# Patient Record
Sex: Female | Born: 1955 | Race: Asian | Hispanic: No | Marital: Married | Smoking: Former smoker
Health system: Southern US, Community
[De-identification: ages and names within clinical notes are randomized; demographics above are authoritative.]

## PROBLEM LIST (undated history)

## (undated) DIAGNOSIS — M899 Disorder of bone, unspecified: Secondary | ICD-10-CM

## (undated) DIAGNOSIS — M8580 Other specified disorders of bone density and structure, unspecified site: Secondary | ICD-10-CM

## (undated) HISTORY — DX: Disorder of bone, unspecified: M89.9

## (undated) HISTORY — DX: Other specified disorders of bone density and structure, unspecified site: M85.80

## (undated) HISTORY — PX: LAPAROSCOPIC HYSTERECTOMY: SHX1926

## (undated) HISTORY — PX: COLONOSCOPY: SHX174

---

## 2016-05-17 LAB — HM COLONOSCOPY

## 2019-08-12 LAB — HM DEXA SCAN

## 2020-11-03 ENCOUNTER — Other Ambulatory Visit: Payer: Self-pay

## 2020-11-03 ENCOUNTER — Ambulatory Visit (INDEPENDENT_AMBULATORY_CARE_PROVIDER_SITE_OTHER): Payer: Medicare HMO | Admitting: Family Medicine

## 2020-11-03 ENCOUNTER — Encounter: Payer: Self-pay | Admitting: Family Medicine

## 2020-11-03 VITALS — BP 126/80 | HR 75 | Ht 63.0 in | Wt 109.0 lb

## 2020-11-03 DIAGNOSIS — G4700 Insomnia, unspecified: Secondary | ICD-10-CM

## 2020-11-03 DIAGNOSIS — R350 Frequency of micturition: Secondary | ICD-10-CM | POA: Diagnosis not present

## 2020-11-03 DIAGNOSIS — M8580 Other specified disorders of bone density and structure, unspecified site: Secondary | ICD-10-CM | POA: Diagnosis not present

## 2020-11-03 DIAGNOSIS — Z1231 Encounter for screening mammogram for malignant neoplasm of breast: Secondary | ICD-10-CM | POA: Diagnosis not present

## 2020-11-03 DIAGNOSIS — R631 Polydipsia: Secondary | ICD-10-CM

## 2020-11-03 DIAGNOSIS — R319 Hematuria, unspecified: Secondary | ICD-10-CM

## 2020-11-03 LAB — POCT URINALYSIS DIP (PROADVANTAGE DEVICE)
Bilirubin, UA: NEGATIVE
Glucose, UA: NEGATIVE mg/dL
Ketones, POC UA: NEGATIVE mg/dL
Leukocytes, UA: NEGATIVE
Nitrite, UA: NEGATIVE
Protein Ur, POC: NEGATIVE mg/dL
Specific Gravity, Urine: 1.025
Urobilinogen, Ur: NEGATIVE
pH, UA: 6 (ref 5.0–8.0)

## 2020-11-03 NOTE — Patient Instructions (Addendum)
It was a pleasure meeting you today.  You can call and schedule your mammogram at the breast center as discussed.  For osteopenia-make sure you are getting adequate calcium in your diet, 1200 mg is recommended each day.  Take a vitamin D3 supplement and get plenty of weightbearing exercises.  It is recommended that you repeat your bone density study after 2 years.  Use good sleep hygiene to help yourself get more than 4 hours of sleep per night.  Avoid TV, telephone, lights or any other distractions to keep you awake.  Avoid eating or caffeine at least 3 hours before bed . You can try melatonin if you would like.

## 2020-11-03 NOTE — Progress Notes (Signed)
   Subjective:    Patient ID: Carrie Maxwell, female    DOB: [birth year], 64 y.o.   MRN: [id number]  HPI Chief Complaint  Patient presents with  . New Patient (Initial Visit)    establish care    She is new to the practice and here to establish care. Moved here from [place] in August 2021.  Previous medical care: in [place]   Other providers: PCP- Dr. Bridgett Larsson in [place]  Osteopenia- taking vitamin D   Sleep issues - sleeping 3-4 hours. States she has always slept this much due to her jobs.  Benadryl helps.   Urinary frequency for years and states she is "always thirsty".  Denies history of diabetes.    Social history: Lives with fiance, 2 kids, retired. 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    Immunizations: Tdap 2017  Health maintenance:  Mammogram: 08/2019 and normal Colonoscopy: 06/2016 in [place]  DEXA: 07/2019 osteopenia  Hysterectomy     Reviewed allergies, medications, past medical, surgical, family, and social history.    Review of Systems Pertinent positives and negatives in the history of present illness.     Objective:   Physical Exam BP 126/80   Pulse 75   Ht 5\' 3"  (1.6 m)   Wt 109 lb (49.4 kg)   SpO2 96%   BMI 19.31 kg/m   Alert and in no distress.  Neck is supple without adenopathy or thyromegaly. Cardiac exam shows a regular sinus rhythm without murmurs or gallops. Lungs are clear to auscultation. Extremities without edema. Skin is warm and dry.        Assessment & Plan:  Insomnia, unspecified type - Plan: Basic metabolic panel, TSH, T4, free -Counseling on good sleep hygiene training herself to sleep when she goes to bed.  Avoid taking her phone to bed with her.  Urinary frequency - Plan: POCT Urinalysis DIP (Proadvantage Device), Basic metabolic panel -No sign of infection  Osteopenia, unspecified location -Recommend getting adequate calcium in her diet, continue on vitamin D supplement and get plenty of  weightbearing exercises as she does  Encounter for screening mammogram for malignant neoplasm of breast - Plan: MM DIGITAL SCREENING BILATERAL -She will call and schedule her mammogram.  Hematuria, unspecified type - Plan: Urine Microscopic -Trace of blood on urinalysis dipstick.  Follow-up pending microscopic urinalysis  Increased thirst - Plan: POCT Urinalysis DIP (Proadvantage Device), Basic metabolic panel, TSH, T4, free -Check labs and follow-up

## 2020-11-04 LAB — BASIC METABOLIC PANEL
BUN/Creatinine Ratio: 16 (ref 12–28)
BUN: 9 mg/dL (ref 8–27)
CO2: 25 mmol/L (ref 20–29)
Calcium: 9.2 mg/dL (ref 8.7–10.3)
Chloride: 102 mmol/L (ref 96–106)
Creatinine, Ser: 0.55 mg/dL — ABNORMAL LOW (ref 0.57–1.00)
GFR calc Af Amer: 113 mL/min/{1.73_m2} (ref >59)
GFR calc non Af Amer: 98 mL/min/{1.73_m2} (ref >59)
Glucose: 91 mg/dL (ref 65–99)
Potassium: 4.3 mmol/L (ref 3.5–5.2)
Sodium: 140 mmol/L (ref 134–144)

## 2020-11-04 LAB — T4, FREE: Free T4: 1.22 ng/dL (ref 0.82–1.77)

## 2020-11-04 LAB — URINALYSIS, MICROSCOPIC ONLY
Bacteria, UA: NONE SEEN
Casts: NONE SEEN /lpf
Epithelial Cells (non renal): NONE SEEN /hpf (ref 0–10)
RBC, Urine: NONE SEEN /hpf (ref 0–2)

## 2020-11-04 LAB — TSH: TSH: 1.99 u[IU]/mL (ref 0.450–4.500)

## 2020-11-04 NOTE — Progress Notes (Signed)
No blood in her urine under the microscope. Her electrolytes and kidney function is fine. Blood sugar normal so no sign of diabetes. Thyroid function also normal.

## 2021-02-28 ENCOUNTER — Ambulatory Visit: Payer: Medicare HMO | Admitting: Family Medicine

## 2021-04-21 ENCOUNTER — Other Ambulatory Visit: Payer: Self-pay

## 2021-04-21 ENCOUNTER — Encounter: Payer: Self-pay | Admitting: Family Medicine

## 2021-04-21 ENCOUNTER — Ambulatory Visit (INDEPENDENT_AMBULATORY_CARE_PROVIDER_SITE_OTHER): Payer: Managed Care, Other (non HMO) | Admitting: Family Medicine

## 2021-04-21 VITALS — BP 110/70 | HR 96 | Ht 64.0 in | Wt 112.2 lb

## 2021-04-21 DIAGNOSIS — Z7189 Other specified counseling: Secondary | ICD-10-CM | POA: Diagnosis not present

## 2021-04-21 DIAGNOSIS — Z1231 Encounter for screening mammogram for malignant neoplasm of breast: Secondary | ICD-10-CM | POA: Diagnosis not present

## 2021-04-21 DIAGNOSIS — Z1211 Encounter for screening for malignant neoplasm of colon: Secondary | ICD-10-CM | POA: Diagnosis not present

## 2021-04-21 DIAGNOSIS — Z7185 Encounter for immunization safety counseling: Secondary | ICD-10-CM | POA: Diagnosis not present

## 2021-04-21 DIAGNOSIS — Z0000 Encounter for general adult medical examination without abnormal findings: Secondary | ICD-10-CM

## 2021-04-21 NOTE — Patient Instructions (Signed)
  Ms. Kreh , Thank you for taking time to come for your Medicare Wellness Visit. I appreciate your ongoing commitment to your health goals. Please review the following plan we discussed and let me know if I can assist you in the future.   These are the goals we discussed:  I will request your medical records from your previous PCP and then get you back for any overdue vaccinations.   Call and schedule your mammogram at [facility].   You will hear from [facility] to discuss colonoscopy     This is a list of the screening recommended for you and due dates:  Health Maintenance  Topic Date Due  . COVID-19 Vaccine (1) Never done  . Hepatitis C Screening: USPSTF Recommendation to screen - Ages 44-79 yo.  Never done  . Tetanus Vaccine  Never done  . Colon Cancer Screening  Never done  . Mammogram  Never done  . DEXA scan (bone density measurement)  Never done  . Pneumonia vaccines (1 of 2 - PCV13) Never done  . Flu Shot  07/17/2021  . HPV Vaccine  Aged Out

## 2021-04-21 NOTE — Progress Notes (Signed)
Carrie Maxwell is a 65 y.o. female who presents for annual wellness visit and follow-up on chronic medical conditions.  She has the following concerns:  She does not think she has had an AWV in the past.   Chief Complaint  Patient presents with  . cpe/awv    Cpe awv, due for pneumonia shot but not sure if she wants it     States she recently had her Covid Booster  She does not have any immunization records with her and her previous medical records will need to be requested today. She denies any concerns.   There is no immunization history on file for this patient. Last Pap smear: had hysterecomy- [place] many years ago Last mammogram: couple years ago in [place] Last colonoscopy: many years ago in [place] Last DEXA: normal in past and does not want another one  Dentist: [facility]: not sure who she saw last year but in [place] Exercise: daily  Other doctors caring for patient include: None   Depression screen:  See questionnaire below.  Depression screen [facility] 2/9 04/21/2021 11/03/2020  Decreased Interest 0 0  Down, Depressed, Hopeless 0 0  PHQ - 2 Score 0 0    Fall Risk Screen: see questionnaire below. Fall Risk  04/21/2021  Falls in the past year? 0  Number falls in past yr: 0  Injury with Fall? 0  Risk for fall due to : No Fall Risks  Follow up Falls evaluation completed    ADL screen:  See questionnaire below Functional Status Survey: Is the patient deaf or have difficulty hearing?: No Does the patient have difficulty seeing, even when wearing glasses/contacts?: No Does the patient have difficulty concentrating, remembering, or making decisions?: No Does the patient have difficulty walking or climbing stairs?: No Does the patient have difficulty dressing or bathing?: No Does the patient have difficulty doing errands alone such as visiting a doctor's office or shopping?: No   End of Life Discussion:  Patient does not have a living will and medical power of  attorney.  Advanced directives discussed and paperwork provided.  Review of Systems Constitutional: -fever, -chills, -sweats, -unexpected weight change, -anorexia, -fatigue Allergy: -sneezing, -itching, -congestion Dermatology: denies changing moles, rash, lumps, new worrisome lesions ENT: -runny nose, -ear pain, -sore throat, -hoarseness, -sinus pain, -teeth pain, -tinnitus, -hearing loss, -epistaxis Cardiology:  -chest pain, -palpitations, -edema, -orthopnea, -paroxysmal nocturnal dyspnea Respiratory: -cough, -shortness of breath, -dyspnea on exertion, -wheezing, -hemoptysis Gastroenterology: -abdominal pain, -nausea, -vomiting, -diarrhea, -constipation, -blood in stool, -changes in bowel movement, -dysphagia Hematology: -bleeding or bruising problems Musculoskeletal: -arthralgias, -myalgias, -joint swelling, -back pain, -neck pain, -cramping, -gait changes Ophthalmology: -vision changes, -eye redness, -itching, -discharge Urology: -dysuria, -difficulty urinating, -hematuria, -urinary frequency, -urgency, -incontinence Neurology: -headache, -weakness, -tingling, -numbness, -speech abnormality, -memory loss, -falls, -dizziness Psychology:  -depressed mood, -agitation, -sleep problems    PHYSICAL EXAM:  BP 110/70   Pulse 96   Ht 5\' 4"  (1.626 m)   Wt 112 lb 3.2 oz (50.9 kg)   BMI 19.26 kg/m   General Appearance: Alert, cooperative, no distress, appears stated age Head: Normocephalic, without obvious abnormality, atraumatic Eyes: PERRL, conjunctiva/corneas clear, EOM's intact Ears: Normal TM's and external ear canals Nose: Mask on Throat: Mask on Neck: Supple, no lymphadenopathy; thyroid: no enlargement/tenderness/nodules; no JVD Back: ROM normal, no CVA tenderness Lungs: Clear to auscultation bilaterally without wheezes, rales or ronchi; respirations unlabored Chest Wall: No tenderness or deformity Heart: Regular rate and rhythm Breast Exam: Declines Abdomen: Soft,  non-tender,  nondistended, normoactive bowel sounds, no masses Genitalia: Declines Extremities: No clubbing, cyanosis or edema Pulses: 2+ and symmetric all extremities Skin: Skin color, texture, turgor normal, no rashes or lesions Lymph nodes: Cervical, supraclavicular, and axillary nodes normal Neurologic: CNII-XII intact, normal strength, sensation and gait Psych: Normal mood, affect, hygiene and grooming.  ASSESSMENT/PLAN: Medicare annual wellness visit, initial -Denies any issues with ADLs, memory, mood and no falls.  Advanced directive counseling done.  Routine general medical examination at a health care facility -Preventive health care reviewed.  She will call and schedule her mammogram.  Referral to GI.  Hysterectomy and declines pelvic exam.  Counseling on healthy lifestyle including diet and exercise.  Recommend regular dental and eye exams.  Immunizations reviewed and I do not have her immunization records.  I will request these today.  Discussed safety and health promotion.  Screen for colon cancer - Plan: Ambulatory referral to Gastroenterology  Encounter for screening mammogram for malignant neoplasm of breast  Advance directive discussed with patient  Immunization counseling     Discussed monthly self breast exams and yearly mammograms; at least 30 minutes of aerobic activity at least 5 days/week and weight-bearing exercise 2x/week; proper sunscreen use reviewed; healthy diet, including goals of calcium and vitamin D intake and alcohol recommendations (less than or equal to 1 drink/day) reviewed; regular seatbelt use; changing batteries in smoke detectors.  Immunization recommendations discussed.  Colonoscopy recommendations reviewed   Medicare Attestation I have personally reviewed: The patient's medical and social history Their use of alcohol, tobacco or illicit drugs Their current medications and supplements The patient's functional ability including ADLs,fall risks, home  safety risks, cognitive, and hearing and visual impairment Diet and physical activities Evidence for depression or mood disorders  The patient's weight, height, and BMI have been recorded in the chart.  I have made referrals, counseling, and provided education to the patient based on review of the above and I have provided the patient with a written personalized care plan for preventive services.     Harland Dingwall, NP-C   04/21/2021

## 2021-04-24 ENCOUNTER — Other Ambulatory Visit: Payer: Self-pay | Admitting: Family Medicine

## 2021-04-24 DIAGNOSIS — Z1231 Encounter for screening mammogram for malignant neoplasm of breast: Secondary | ICD-10-CM

## 2021-04-25 ENCOUNTER — Encounter: Payer: Self-pay | Admitting: Internal Medicine

## 2021-05-25 ENCOUNTER — Telehealth: Payer: Self-pay

## 2021-05-25 NOTE — Telephone Encounter (Signed)
Tried to call pt on both numbers listed but can not get through  Per GI- she needs an elective colonoscopy, we will call her in about 6 weeks or so. We are now working on our emergent and urgent referrals. she is more than happy to call the office at [phone] option 1 if she would like to make an appointment before that.

## 2021-05-25 NOTE — Telephone Encounter (Signed)
Pt states she hasn't heard anything about her colonoscopy.  Please let her know something

## 2021-05-26 NOTE — Telephone Encounter (Signed)
Tried to call pt multiple times but can not get through

## 2021-05-26 NOTE — Telephone Encounter (Signed)
[place] handling

## 2021-05-26 NOTE — Telephone Encounter (Signed)
Tried to call pt but pt is unreachable again today. Call will not go through

## 2021-06-13 DIAGNOSIS — Z0279 Encounter for issue of other medical certificate: Secondary | ICD-10-CM

## 2021-06-20 ENCOUNTER — Other Ambulatory Visit: Payer: Self-pay

## 2021-06-20 ENCOUNTER — Ambulatory Visit
Admission: RE | Admit: 2021-06-20 | Discharge: 2021-06-20 | Disposition: A | Discharge status: Home / Self Care | Payer: Medicare HMO | Source: Ambulatory Visit | Attending: Family Medicine | Admitting: Family Medicine

## 2021-06-20 DIAGNOSIS — Z1231 Encounter for screening mammogram for malignant neoplasm of breast: Secondary | ICD-10-CM

## 2021-08-02 ENCOUNTER — Telehealth: Payer: Self-pay | Admitting: Gastroenterology

## 2021-08-02 NOTE — Telephone Encounter (Signed)
Hey Dr. Candis Maxwell,   We received a transfer of care in our office for colonoscopy. We have the records. Could you please review and advise on scheduling?  Thank you

## 2021-08-04 NOTE — Telephone Encounter (Deleted)
Dr. Candis Schatz,   I believe the path results are within the big packet towards one of the bottom of the pages. Sorry I can't exactly remember where. I thought I saw it before I forwarded them to you. I could ask the patient again to be sure once I call to get her scheduled.

## 2021-08-04 NOTE — Telephone Encounter (Signed)
Hey Dr. Candis Schatz,   I have noted the pathology results within the records. If that is not what you are looking for please let me know.  Thank you

## 2021-08-08 ENCOUNTER — Encounter: Payer: Self-pay | Admitting: Gastroenterology

## 2021-08-08 NOTE — Telephone Encounter (Signed)
Left vm to return call to schedule appt 

## 2021-08-15 ENCOUNTER — Other Ambulatory Visit: Payer: Self-pay

## 2021-08-15 ENCOUNTER — Ambulatory Visit (AMBULATORY_SURGERY_CENTER): Payer: Self-pay

## 2021-08-15 VITALS — Ht 64.0 in | Wt 113.0 lb

## 2021-08-15 DIAGNOSIS — Z86010 Personal history of colonic polyps: Secondary | ICD-10-CM

## 2021-08-15 MED ORDER — PEG 3350-KCL-NA BICARB-NACL 420 G PO SOLR: 4000.0000 mL | Freq: Once | ORAL | 0 refills | Status: AC

## 2021-08-15 NOTE — Progress Notes (Signed)
Patient is here in-person for PV. Patient denies any allergies to eggs or soy. Patient denies any problems with anesthesia/sedation. Patient denies any oxygen use at home. Patient denies taking any diet/weight loss medications or blood thinners. Patient is aware of our care-partner policy and Covid-19 safety protocol.   EMMI education assigned to the patient for the procedure, sent to MyChart.   Patient is COVID-19 vaccinated. 

## 2021-08-23 ENCOUNTER — Encounter: Payer: Self-pay | Admitting: Gastroenterology

## 2021-08-27 IMAGING — MG MM DIGITAL SCREENING BILAT W/ TOMO AND CAD
8 series · 9 of 24 positions shown · non-contrast
Comparison: Previous exam(s).

CLINICAL DATA: Screening.

EXAM:
DIGITAL SCREENING BILATERAL MAMMOGRAM WITH TOMOSYNTHESIS AND CAD
TECHNIQUE: Bilateral screening digital craniocaudal and mediolateral oblique
mammograms were obtained. Bilateral screening digital breast
tomosynthesis was performed. The images were evaluated with
computer-aided detection.

[R MLO synth-2D]
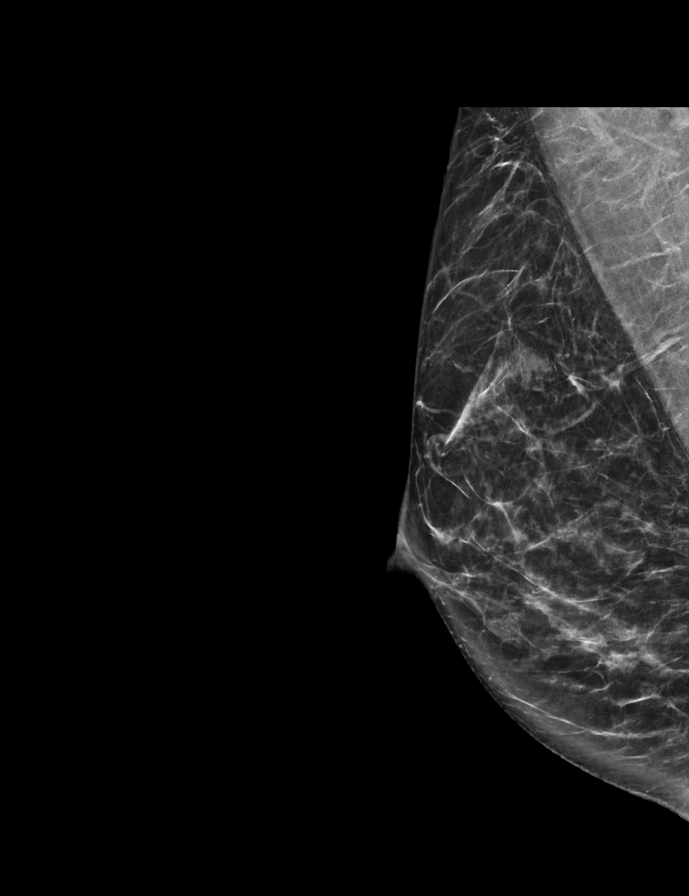

[L MLO synth-2D]
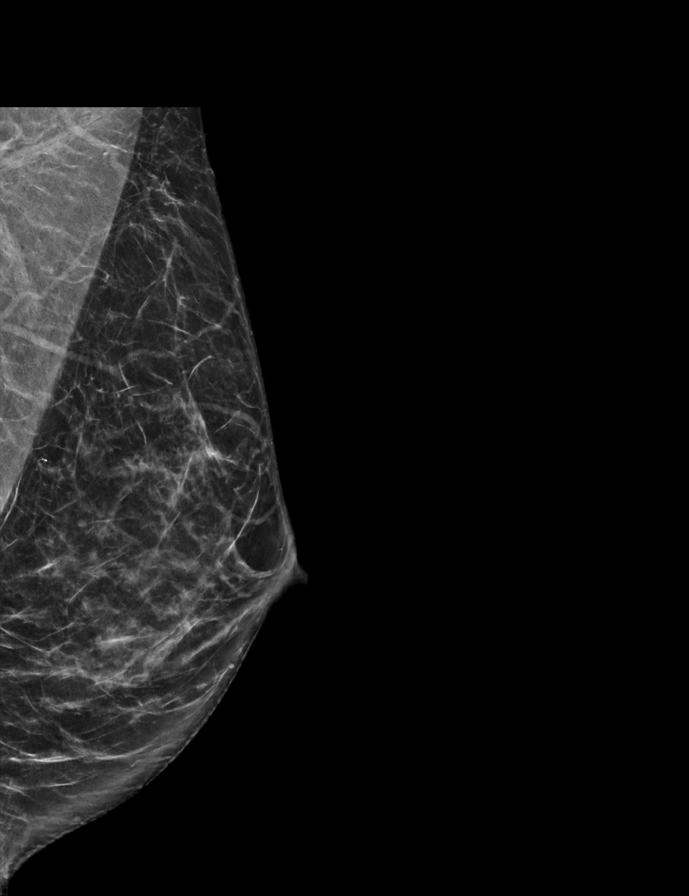

[L CC synth-2D]
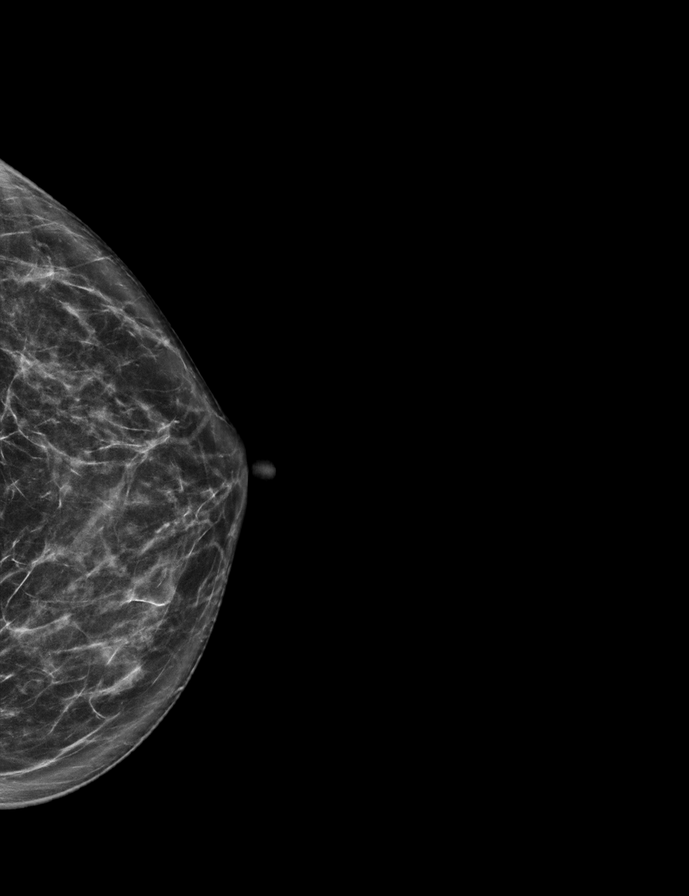

[R CC synth-2D]
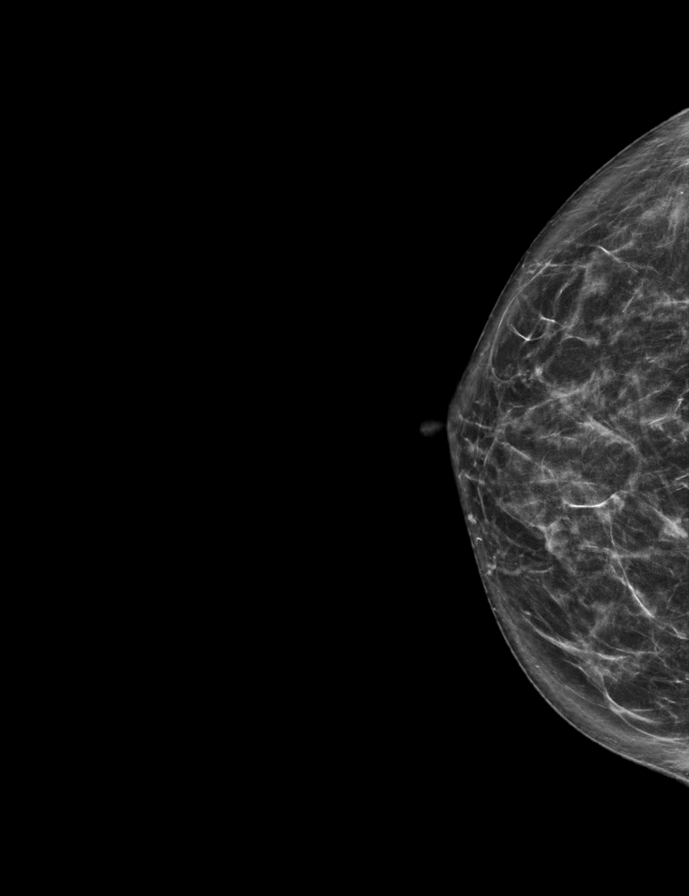

[R CC tomo · 2 of 66 frames shown]
[frame 22/66]
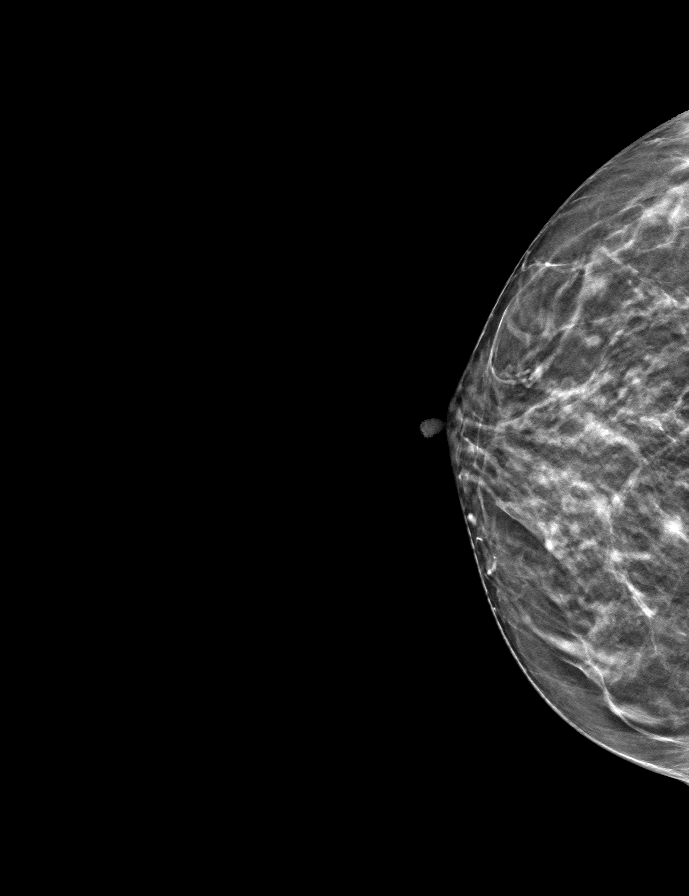
[frame 33/66]
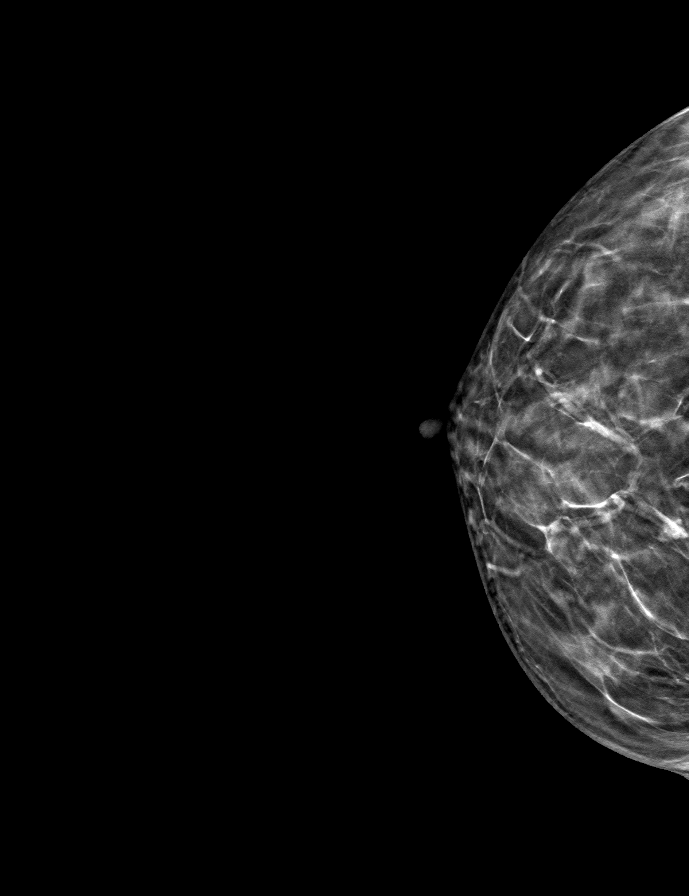

[R MLO tomo · tomo slice 34/67.0]
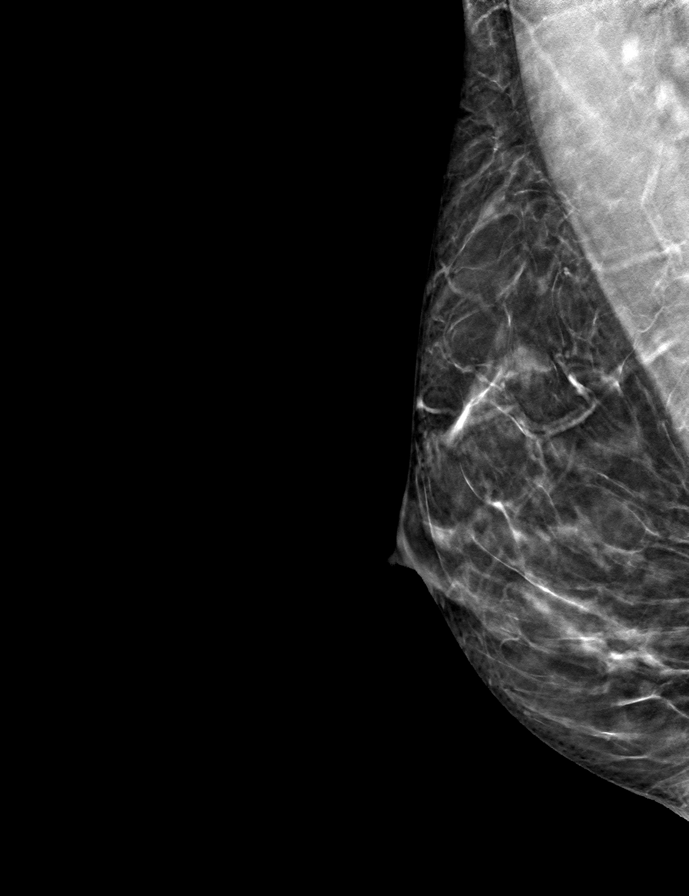

[L MLO tomo · tomo slice 34/67.0]
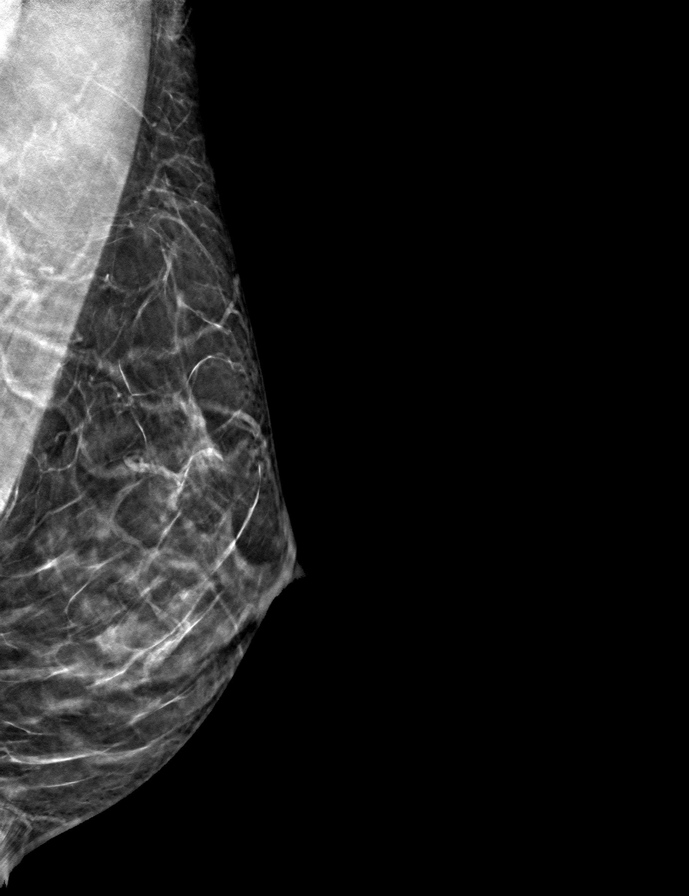

[L CC tomo · tomo slice 33/66.0]
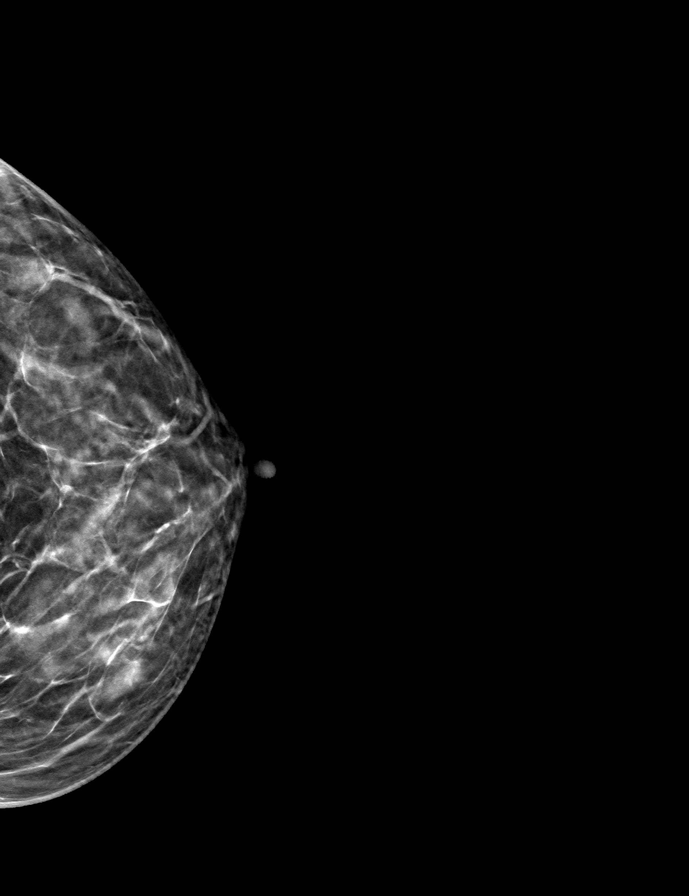

[9 of 24 positions shown; findings below may reference images not displayed]

ACR Breast Density Category c: The breast tissue is heterogeneously
dense, which may obscure small masses.
FINDINGS: There are no findings suspicious for malignancy.
IMPRESSION: No mammographic evidence of malignancy. A result letter of this
screening mammogram will be mailed directly to the patient.

RECOMMENDATION:
Screening mammogram in one year. (Code:[id number]-BC3)

BI-RADS CATEGORY  1: Negative.

## 2021-08-29 ENCOUNTER — Encounter: Payer: Self-pay | Admitting: Gastroenterology

## 2021-08-29 ENCOUNTER — Ambulatory Visit (AMBULATORY_SURGERY_CENTER): Payer: Managed Care, Other (non HMO) | Admitting: Gastroenterology

## 2021-08-29 ENCOUNTER — Other Ambulatory Visit: Payer: Self-pay

## 2021-08-29 VITALS — BP 112/73 | HR 60 | Temp 97.1°F | Resp 11 | Ht 64.0 in | Wt 113.0 lb

## 2021-08-29 DIAGNOSIS — D123 Benign neoplasm of transverse colon: Secondary | ICD-10-CM | POA: Diagnosis not present

## 2021-08-29 DIAGNOSIS — Z86010 Personal history of colonic polyps: Secondary | ICD-10-CM | POA: Diagnosis not present

## 2021-08-29 DIAGNOSIS — K5730 Diverticulosis of large intestine without perforation or abscess without bleeding: Secondary | ICD-10-CM

## 2021-08-29 DIAGNOSIS — D127 Benign neoplasm of rectosigmoid junction: Secondary | ICD-10-CM

## 2021-08-29 DIAGNOSIS — K640 First degree hemorrhoids: Secondary | ICD-10-CM

## 2021-08-29 MED ORDER — SODIUM CHLORIDE 0.9 % IV SOLN: 500.0000 mL | Freq: Once | INTRAVENOUS | Status: DC

## 2021-08-29 NOTE — Progress Notes (Signed)
Called to room to assist during endoscopic procedure.  Patient ID and intended procedure confirmed with present staff. Received instructions for my participation in the procedure from the performing physician.  

## 2021-08-29 NOTE — Patient Instructions (Signed)
Resume previous diet and continue present medications. Awaiting pathology results. Repeat Colonoscopy date to be determined based on pathology results.  YOU HAD AN ENDOSCOPIC PROCEDURE TODAY AT [facility]:   Refer to the procedure report that was given to you for any specific questions about what was found during the examination.  If the procedure report does not answer your questions, please call your gastroenterologist to clarify.  If you requested that your care partner not be given the details of your procedure findings, then the procedure report has been included in a sealed envelope for you to review at your convenience later.  YOU SHOULD EXPECT: Some feelings of bloating in the abdomen. Passage of more gas than usual.  Walking can help get rid of the air that was put into your GI tract during the procedure and reduce the bloating. If you had a lower endoscopy (such as a colonoscopy or flexible sigmoidoscopy) you may notice spotting of blood in your stool or on the toilet paper. If you underwent a bowel prep for your procedure, you may not have a normal bowel movement for a few days.  Please Note:  You might notice some irritation and congestion in your nose or some drainage.  This is from the oxygen used during your procedure.  There is no need for concern and it should clear up in a day or so.  SYMPTOMS TO REPORT IMMEDIATELY:  Following lower endoscopy (colonoscopy or flexible sigmoidoscopy):  Excessive amounts of blood in the stool  Significant tenderness or worsening of abdominal pains  Swelling of the abdomen that is new, acute  Fever of 100F or higher  For urgent or emergent issues, a gastroenterologist can be reached at any hour by calling [phone]. Do not use MyChart messaging for urgent concerns.    DIET:  We do recommend a small meal at first, but then you may proceed to your regular diet.  Drink plenty of fluids but you should avoid alcoholic beverages  for 24 hours.  ACTIVITY:  You should plan to take it easy for the rest of today and you should NOT DRIVE or use heavy machinery until tomorrow (because of the sedation medicines used during the test).    FOLLOW UP: Our staff will call the number listed on your records 48-72 hours following your procedure to check on you and address any questions or concerns that you may have regarding the information given to you following your procedure. If we do not reach you, we will leave a message.  We will attempt to reach you two times.  During this call, we will ask if you have developed any symptoms of COVID 19. If you develop any symptoms (ie: fever, flu-like symptoms, shortness of breath, cough etc.) before then, please call [phone].  If you test positive for Covid 19 in the 2 weeks post procedure, please call and report this information to [place].    If any biopsies were taken you will be contacted by phone or by letter within the next 1-3 weeks.  Please call us at [phone] if you have not heard about the biopsies in 3 weeks.    SIGNATURES/CONFIDENTIALITY: You and/or your care partner have signed paperwork which will be entered into your electronic medical record.  These signatures attest to the fact that that the information above on your After Visit Summary has been reviewed and is understood.  Full responsibility of the confidentiality of this discharge information lies with you and/or your  care-partner.  

## 2021-08-29 NOTE — Progress Notes (Signed)
C.W. vital signs. 

## 2021-08-29 NOTE — Op Note (Signed)
[facility] Patient Name: Carrie Maxwell Procedure Date: 08/29/2021 7:37 AM MRN: [id number] Endoscopist: Nicki Reaper E. Candis Schatz , MD Age: 65 Referring MD:  Date of Birth: 25-Jul-1956 Gender: Female Account #: [id number] Procedure:                Colonoscopy Indications:              High risk colon cancer surveillance: Personal                            history of colonic polyps Medicines:                Monitored Anesthesia Care Procedure:                Pre-Anesthesia Assessment:                           - Prior to the procedure, a History and Physical                            was performed, and patient medications and                            allergies were reviewed. The patient's tolerance of                            previous anesthesia was also reviewed. The risks                            and benefits of the procedure and the sedation                            options and risks were discussed with the patient.                            All questions were answered, and informed consent                            was obtained. Prior Anticoagulants: The patient has                            taken no previous anticoagulant or antiplatelet                            agents. ASA Grade Assessment: II - A patient with                            mild systemic disease. After reviewing the risks                            and benefits, the patient was deemed in                            satisfactory condition to undergo the procedure.  After obtaining informed consent, the colonoscope                            was passed under direct vision. Throughout the                            procedure, the patient's blood pressure, pulse, and                            oxygen saturations were monitored continuously. The                            Olympus [id number]-[id number]) Colonoscope was                            introduced through the anus and  advanced to the the                            terminal ileum, with identification of the                            appendiceal orifice and IC valve. The colonoscopy                            was performed without difficulty. The patient                            tolerated the procedure well. The quality of the                            bowel preparation was adequate. Scope In: 8:06:35 AM Scope Out: 8:31:02 AM Scope Withdrawal Time: 0 hours 21 minutes 23 seconds  Total Procedure Duration: 0 hours 24 minutes 27 seconds  Findings:                 The perianal and digital rectal examinations were                            normal. Pertinent negatives include normal                            sphincter tone and no palpable rectal lesions.                           A 3 mm polyp was found in the transverse colon. The                            polyp was sessile. The polyp was removed with a                            cold snare. Resection and retrieval were complete.                            Estimated blood loss was minimal.  A 3 mm polyp was found in the recto-sigmoid colon.                            The polyp was sessile. The polyp was removed with a                            cold snare. Resection and retrieval were complete.                            Estimated blood loss was minimal.                           Multiple small and large-mouthed diverticula were                            found in the transverse colon, ascending colon and                            cecum.                           The exam was otherwise normal throughout the                            examined colon.                           The terminal ileum appeared normal.                           Non-bleeding internal hemorrhoids were found during                            retroflexion.                           No additional abnormalities were found on                             retroflexion. Complications:            No immediate complications. Estimated Blood Loss:     Estimated blood loss was minimal. Impression:               - One 3 mm polyp in the transverse colon, removed                            with a cold snare. Resected and retrieved.                           - One 3 mm polyp at the recto-sigmoid colon,                            removed with a cold snare. Resected and retrieved.                           -  Diverticulosis in the transverse colon, in the                            ascending colon and in the cecum.                           - The examined portion of the ileum was normal.                           - Non-bleeding internal hemorrhoids. Recommendation:           - Patient has a contact number available for                            emergencies. The signs and symptoms of potential                            delayed complications were discussed with the                            patient. Return to normal activities tomorrow.                            Written discharge instructions were provided to the                            patient.                           - Resume previous diet.                           - Continue present medications.                           - Await pathology results.                           - Repeat colonoscopy (date not yet determined) for                            surveillance based on pathology results. Nadene Witherspoon E. Candis Schatz, MD 08/29/2021 8:37:55 AM This report has been signed electronically.

## 2021-08-29 NOTE — Progress Notes (Signed)
To pacu, VSS. Report to [email or web address] 

## 2021-08-29 NOTE — Progress Notes (Signed)
[facility] History and Physical   Primary Care Physician:  Girtha Rm, NP-C   Reason for Procedure:   Surveillance colonoscopy (2 polyps in 2017, fair prep quality)  Plan:    Surveillance colonoscopy     HPI: Carrie Maxwell is a 65 y.o. female undergoing surveillance colonoscopy.  She had a colonoscopy in 2017 in which two small polyps were removed but the bowel prep was fair quality.  She has no chronic GI symptoms and no family history of colon cancer.   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 Authorizing Provider  Multiple Vitamin (MULTIVITAMIN) tablet Take 1 tablet by mouth daily.   Yes [provider]    Current Outpatient Medications  Medication Sig Dispense Refill   Multiple Vitamin (MULTIVITAMIN) tablet Take 1 tablet by mouth daily.     Current Facility-Administered Medications  Medication Dose Route Frequency Provider Last Rate Last Admin   0.9 %  sodium chloride infusion  500 mL Intravenous Once Daryel November, MD        Allergies as of 08/29/2021   (No Known Allergies)    Family History  Problem Relation Age of Onset   Colon cancer Neg Hx    Esophageal cancer Neg Hx    Stomach cancer Neg Hx    Rectal cancer Neg Hx     Social History   Socioeconomic History   Marital status: Significant Other    Spouse name: Not on file   Number of children: Not on file   Years of education: Not on file   Highest education level: Not on file  Occupational History   Not on file  Tobacco Use   Smoking status: Former    Types: Cigarettes   Smokeless tobacco: Never  Vaping Use   Vaping Use: Never used  Substance and Sexual Activity   Alcohol use: Yes    Comment: occasionally   Drug use:  Not Currently   Sexual activity: Not on file  Other Topics Concern   Not on file  Social History Narrative   Not on file   Social Determinants of Health   Financial Resource Strain: Not on file  Food Insecurity: Not on file  Transportation Needs: Not on file  Physical Activity: Not on file  Stress: Not on file  Social Connections: Not on file  Intimate Partner Violence: Not on file    Review of Systems:  All other review of systems negative except as mentioned in the HPI.  Physical Exam: Vital signs BP (!) 134/92   Pulse 74   Temp (!) 97.1 F (36.2 C)   Ht '5\' 4"'$  (1.626 m)   Wt 113 lb (51.3 kg)   SpO2 98%   BMI 19.40 kg/m   General:   Alert,  Well-developed, well-nourished, pleasant and cooperative in NAD Lungs:  Clear throughout to auscultation.   Heart:  Regular rate and rhythm; Systolic murmur, 3/6 RUSB, no clicks, rubs,  or gallops. Abdomen:  Soft, nontender and nondistended. Normal bowel sounds.   Neuro/Psych:  Normal mood and affect. A and O x 3   Dellanira Dillow E. Candis Schatz, MD [facility]

## 2021-08-29 NOTE — Progress Notes (Signed)
Pt's states no medical or surgical changes since previsit or office visit. 

## 2021-08-31 ENCOUNTER — Telehealth: Payer: Self-pay | Admitting: *Deleted

## 2021-08-31 NOTE — Telephone Encounter (Signed)
  Follow up Call-  Call back number 08/29/2021  Post procedure Call Back phone  # [phone]  Permission to leave phone message Yes     Patient questions:  Do you have a fever, pain , or abdominal swelling? No. Pain Score  0 *  Have you tolerated food without any problems? Yes.    Have you been able to return to your normal activities? Yes.    Do you have any questions about your discharge instructions: Diet   No. Medications  No. Follow up visit  No.  Do you have questions or concerns about your Care? No.  Actions: * If pain score is 4 or above: No action needed, pain <4.  Have you developed a fever since your procedure? no  2.   Have you had an respiratory symptoms (SOB or cough) since your procedure? no  3.   Have you tested positive for COVID 19 since your procedure no  4.   Have you had any family members/close contacts diagnosed with the COVID 19 since your procedure?  no   If yes to any of these questions please route to Joylene John, RN and Joella Prince, RN

## 2021-08-31 NOTE — Telephone Encounter (Signed)
Attempted to call patient for their post-procedure follow-up call. No answer. Left voicemail.   

## 2021-09-01 ENCOUNTER — Encounter: Payer: Self-pay | Admitting: Gastroenterology

## 2022-04-24 ENCOUNTER — Ambulatory Visit: Payer: Medicare HMO | Admitting: Family Medicine

## 2022-06-15 ENCOUNTER — Encounter: Payer: Self-pay | Admitting: Internal Medicine

## 2022-08-22 ENCOUNTER — Encounter: Payer: Self-pay | Admitting: Internal Medicine

## 2022-09-25 ENCOUNTER — Encounter: Payer: Self-pay | Admitting: Internal Medicine

## 2022-10-30 ENCOUNTER — Encounter: Payer: Self-pay | Admitting: Internal Medicine

## 2023-02-20 ENCOUNTER — Other Ambulatory Visit: Payer: Self-pay

## 2023-02-20 ENCOUNTER — Ambulatory Visit: Payer: PPO | Admitting: Internal Medicine

## 2023-02-20 ENCOUNTER — Encounter: Payer: Self-pay | Admitting: Internal Medicine

## 2023-02-20 VITALS — BP 110/70 | HR 75 | Temp 97.7°F | Resp 18 | Ht 64.0 in | Wt 105.4 lb

## 2023-02-20 DIAGNOSIS — M810 Age-related osteoporosis without current pathological fracture: Secondary | ICD-10-CM | POA: Insufficient documentation

## 2023-02-20 MED ORDER — ALENDRONATE SODIUM 70 MG PO TABS: 70.0000 mg | ORAL_TABLET | ORAL | 3 refills | Status: DC

## 2023-02-20 NOTE — Assessment & Plan Note (Signed)
Will start fosamax 70 mg once a week.

## 2023-02-20 NOTE — Progress Notes (Signed)
   Office Visit  Subjective   Patient ID: Carrie Maxwell   DOB: [birth year]   Age: 67 y.o.   MRN: [id number]   Chief Complaint Chief Complaint  Patient presents with   Follow-up     History of Present Illness The patient is a 67 year old Other Race female who has a history of GERD and recurrent UTI's and insomnia. The patient states the GERD has been well controlled on the patient's current medical regimen and responsive to dietary modifications. She has no current symptoms. The patient denies chest pain, palpitations, fever, chills, fatigue, malaise, weight loss, weight gain, rectal bleeding, an anxious mood, and a depressed mood. The patient's medications include famotidine 20 mg tablet. The patient has no medical records available for review.             She takes multivitamins  and fish oil and reports hx of elevated cholesterol. She also uses a sleep aid due to insomnia which may be related to her old work habits where she did many swing shift or double shifts.   She had a hysterectomy and does not get pap smears. Last colonoscopy 08/2021 @ [facility]. 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   HENT: Negative.    Respiratory: Negative.    Cardiovascular: Negative.   Gastrointestinal: Negative.   Neurological: Negative.        Objective:    Vitals BP 110/70 (BP Location: Left Arm, Patient Position: Sitting, Cuff Size: Normal)   Pulse 75   Temp 97.7 F (36.5 C)   Resp 18   Ht '5\' 4"'$  (1.626 m)   Wt 105 lb 6 oz (47.8 kg)   SpO2 96%   BMI  18.09 kg/m    Physical Examination Physical Exam Constitutional:      Appearance: Normal appearance.  HENT:     Head: Normocephalic and atraumatic.  Eyes:     Extraocular Movements: Extraocular movements intact.     Pupils: Pupils are equal, round, and reactive to light.  Cardiovascular:     Rate and Rhythm: Normal rate and regular rhythm.     Heart sounds: Normal heart sounds.  Pulmonary:     Effort: Pulmonary effort is normal.     Breath sounds: Normal breath sounds.  Abdominal:     General: Bowel sounds are normal.     Palpations: Abdomen is soft.  Neurological:     General: No focal deficit present.     Mental Status: She is alert and oriented to person, place, and time.        Assessment & Plan:   Osteoporosis Will start fosamax 70 mg once a week.    Return in about 3 months (around 05/23/2023).   Garwin Brothers, MD

## 2023-05-22 ENCOUNTER — Ambulatory Visit: Payer: PPO | Admitting: Internal Medicine

## 2023-05-22 ENCOUNTER — Encounter: Payer: Self-pay | Admitting: Internal Medicine

## 2023-05-22 VITALS — BP 124/84 | HR 68 | Temp 97.6°F | Resp 18 | Ht 64.0 in | Wt 104.1 lb

## 2023-05-22 DIAGNOSIS — M810 Age-related osteoporosis without current pathological fracture: Secondary | ICD-10-CM | POA: Diagnosis not present

## 2023-05-22 DIAGNOSIS — Z0000 Encounter for general adult medical examination without abnormal findings: Secondary | ICD-10-CM | POA: Insufficient documentation

## 2023-05-22 DIAGNOSIS — M130 Polyarthritis, unspecified: Secondary | ICD-10-CM | POA: Diagnosis not present

## 2023-05-22 DIAGNOSIS — Z131 Encounter for screening for diabetes mellitus: Secondary | ICD-10-CM

## 2023-05-22 NOTE — Assessment & Plan Note (Signed)
I will xray of hand for evaluation of arthritis and deformity

## 2023-05-22 NOTE — Assessment & Plan Note (Signed)
Will do HbA1c 

## 2023-05-22 NOTE — Assessment & Plan Note (Signed)
She take fosamax once a week and need vitamin D elevl

## 2023-05-22 NOTE — Progress Notes (Signed)
   Office Visit  Subjective   Patient ID: Carrie Maxwell   DOB: 04-Jul-1956   Age: 67 y.o.   MRN: [id number]   Chief Complaint No chief complaint on file.    History of Present Illness 67 years old female is here for follow up. She take foasamax once a week and denies any side effects. She has colonoscopy in 9.22 and 2 polyps were removed, she is not sure when she need another colonoscopy.  She is c/o arthritis of her interphalyngeal goint and she says it hurt. She does not take any medicine for pain.   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   Respiratory: Negative.    Cardiovascular: Negative.   Musculoskeletal:  Positive for joint pain.  Neurological: Negative.        Objective:    Vitals BP 124/84 (BP Location: Left Arm, Patient Position: Sitting, Cuff Size: Normal)   Pulse 68   Temp 97.6 F (36.4 C)   Resp 18   Ht 5\' 4"  (1.626 m)   Wt 104 lb 2 oz (47.2 kg)   SpO2 97%   BMI 17.87 kg/m    Physical Examination Physical Exam Constitutional:      Appearance: Normal appearance.  Cardiovascular:     Rate and Rhythm: Normal rate and regular rhythm.     Heart sounds: Normal heart sounds.  Pulmonary:     Effort: Pulmonary effort is normal.     Breath sounds: Normal breath sounds.  Abdominal:     General: Bowel sounds are normal.     Palpations: Abdomen is soft.  Musculoskeletal:        General: Swelling and tenderness present.     Comments: Her interphalyngeal joints are swollen with deformity  Neurological:     General: No focal deficit present.     Mental Status: She is alert and oriented to person, place, and time.        Assessment & Plan:   Polyarthritis I will xray of hand for evaluation of arthritis and deformity  Osteoporosis She take fosamax once a week and need  vitamin D elevl  Screening for diabetes mellitus (DM) Will do HbA1c    Return in about 3 months (around 08/22/2023).   Eloisa Northern, MD

## 2023-05-27 LAB — CMP14 + ANION GAP
ALT: 19 IU/L (ref 0–32)
AST: 29 IU/L (ref 0–40)
Albumin/Globulin Ratio: 1.4 (ref 1.2–2.2)
Albumin: 3.9 g/dL (ref 3.9–4.9)
Alkaline Phosphatase: 77 IU/L (ref 44–121)
Anion Gap: 12 mmol/L (ref 10.0–18.0)
BUN/Creatinine Ratio: 15 (ref 12–28)
BUN: 11 mg/dL (ref 8–27)
Bilirubin Total: 0.5 mg/dL (ref 0.0–1.2)
CO2: 26 mmol/L (ref 20–29)
Calcium: 9.7 mg/dL (ref 8.7–10.3)
Chloride: 105 mmol/L (ref 96–106)
Creatinine, Ser: 0.75 mg/dL (ref 0.57–1.00)
Globulin, Total: 2.7 g/dL (ref 1.5–4.5)
Glucose: 98 mg/dL (ref 70–99)
Potassium: 4.7 mmol/L (ref 3.5–5.2)
Sodium: 143 mmol/L (ref 134–144)
Total Protein: 6.6 g/dL (ref 6.0–8.5)
eGFR: 86 mL/min/{1.73_m2} (ref >59)

## 2023-05-27 LAB — LIPID PANEL
Chol/HDL Ratio: 2.3 ratio (ref 0.0–4.4)
Cholesterol, Total: 240 mg/dL — ABNORMAL HIGH (ref 100–199)
HDL: 104 mg/dL (ref >39)
LDL Chol Calc (NIH): 124 mg/dL — ABNORMAL HIGH (ref 0–99)
Triglycerides: 74 mg/dL (ref 0–149)
VLDL Cholesterol Cal: 12 mg/dL (ref 5–40)

## 2023-05-27 LAB — HEMOGLOBIN A1C
Est. average glucose Bld gHb Est-mCnc: 117 mg/dL
Hgb A1c MFr Bld: 5.7 % — ABNORMAL HIGH (ref 4.8–5.6)

## 2023-05-27 LAB — CBC WITH DIFFERENTIAL/PLATELET
Basophils Absolute: 0.1 10*3/uL (ref 0.0–0.2)
Basos: 2 %
EOS (ABSOLUTE): 0.2 10*3/uL (ref 0.0–0.4)
Eos: 3 %
Hematocrit: 39.2 % (ref 34.0–46.6)
Hemoglobin: 13.6 g/dL (ref 11.1–15.9)
Immature Grans (Abs): 0 10*3/uL (ref 0.0–0.1)
Immature Granulocytes: 0 %
Lymphocytes Absolute: 2 10*3/uL (ref 0.7–3.1)
Lymphs: 30 %
MCH: 33.9 pg — ABNORMAL HIGH (ref 26.6–33.0)
MCHC: 34.7 g/dL (ref 31.5–35.7)
MCV: 98 fL — ABNORMAL HIGH (ref 79–97)
Monocytes Absolute: 0.5 10*3/uL (ref 0.1–0.9)
Monocytes: 7 %
Neutrophils Absolute: 3.7 10*3/uL (ref 1.4–7.0)
Neutrophils: 58 %
Platelets: 226 10*3/uL (ref 150–450)
RBC: 4.01 x10E6/uL (ref 3.77–5.28)
RDW: 11.8 % (ref 11.7–15.4)
WBC: 6.5 10*3/uL (ref 3.4–10.8)

## 2023-05-27 LAB — C-REACTIVE PROTEIN: CRP: 3 mg/L (ref 0–10)

## 2023-05-27 LAB — SEDIMENTATION RATE: Sed Rate: 14 mm/hr (ref 0–40)

## 2023-05-27 LAB — ANTINUCLEAR ANTIBODIES, IFA: ANA Titer 1: NEGATIVE

## 2023-05-27 LAB — VITAMIN D 25 HYDROXY (VIT D DEFICIENCY, FRACTURES): Vit D, 25-Hydroxy: 38.8 ng/mL (ref 30.0–100.0)

## 2023-08-06 ENCOUNTER — Encounter: Payer: Self-pay | Admitting: Internal Medicine

## 2023-08-21 ENCOUNTER — Encounter: Payer: PPO | Admitting: Internal Medicine

## 2023-09-25 ENCOUNTER — Ambulatory Visit: Payer: PPO | Admitting: Internal Medicine

## 2023-09-25 ENCOUNTER — Encounter: Payer: Self-pay | Admitting: Internal Medicine

## 2023-09-25 VITALS — BP 130/90 | HR 64 | Temp 98.1°F | Resp 18 | Ht 64.0 in | Wt 102.5 lb

## 2023-09-25 DIAGNOSIS — R7303 Prediabetes: Secondary | ICD-10-CM | POA: Diagnosis not present

## 2023-09-25 DIAGNOSIS — Z23 Encounter for immunization: Secondary | ICD-10-CM | POA: Diagnosis not present

## 2023-09-25 DIAGNOSIS — Z681 Body mass index (BMI) 19 or less, adult: Secondary | ICD-10-CM

## 2023-09-25 DIAGNOSIS — K219 Gastro-esophageal reflux disease without esophagitis: Secondary | ICD-10-CM

## 2023-09-25 DIAGNOSIS — Z131 Encounter for screening for diabetes mellitus: Secondary | ICD-10-CM | POA: Diagnosis not present

## 2023-09-25 DIAGNOSIS — Z0000 Encounter for general adult medical examination without abnormal findings: Secondary | ICD-10-CM | POA: Diagnosis not present

## 2023-09-25 DIAGNOSIS — M810 Age-related osteoporosis without current pathological fracture: Secondary | ICD-10-CM

## 2023-09-25 DIAGNOSIS — M130 Polyarthritis, unspecified: Secondary | ICD-10-CM | POA: Diagnosis not present

## 2023-09-25 MED ORDER — SHINGRIX 50 MCG/0.5ML IM SUSR
0.5000 mL | Freq: Once | INTRAMUSCULAR | 0 refills | Status: AC
Start: 2023-09-25 — End: 2023-09-25

## 2023-09-25 NOTE — Assessment & Plan Note (Signed)
Will give her flu shot today and I will send her prescription for pneumonia vaccine and shingle vaccine to her pharmacy.

## 2023-09-25 NOTE — Assessment & Plan Note (Signed)
She takes pepcid as needed

## 2023-09-25 NOTE — Progress Notes (Addendum)
Subjective:     Carrie Maxwell is a 67 y.o. female who presents for Medicare Annual/Subsequent preventive examination. She has osteoporosis and she takes fosamax once a week. She denies any side effects. She also do exercise. She also take vitamin D supplement.   She also has GERD and she tales pepcid for that and is helping.    She live with her husband, no fall within last 1 year.   She has hysterectomy and does not get pap smear. She has mammogram this year at [facility]. She has colonoscopy in 08/2021 and 3 polyps were removed, her next colonoscopy will be may be next year and they will call her.   She has Dexa scan done on 05/2022 that showed osteoprosis and she takes fosamax for that.   She does not have pneumonia vaccine and her shingle vaccine was many years ago. She need flu shot this year. I have reviewed her MMSE and other screening.      Preventive Screening-Counseling & Management  Tobacco Social History   Tobacco Use  Smoking Status Former   Types: Cigarettes  Smokeless Tobacco Never    Current Problems (verified) Patient Active Problem List   Diagnosis Date Noted   Screening for diabetes mellitus (DM) 05/22/2023   Polyarthritis 05/22/2023   Osteoporosis 02/20/2023   Osteopenia     Medications Prior to Visit Current Outpatient Medications on File Prior to Visit  Medication Sig Dispense Refill   alendronate (FOSAMAX) 70 MG tablet Take 1 tablet (70 mg total) by mouth every 7 (seven) days. Take with a full glass of water on an empty stomach. 4 tablet 3   Multiple Vitamin (MULTIVITAMIN) tablet Take 1 tablet by mouth daily.     No current facility-administered medications on file prior to visit.    Current Medications (verified) Current Outpatient Medications  Medication Sig Dispense Refill   alendronate (FOSAMAX) 70 MG tablet Take 1 tablet (70 mg total) by mouth every 7 (seven) days. Take with a full glass of water on an empty stomach. 4 tablet 3    Multiple Vitamin (MULTIVITAMIN) tablet Take 1 tablet by mouth daily.     No current facility-administered medications for this visit.     Allergies (verified) Patient has no known allergies.    PAST HISTORY  Family History Family History  Problem Relation Age of Onset   Colon cancer Neg Hx    Esophageal cancer Neg Hx    Stomach cancer Neg Hx    Rectal cancer Neg Hx     Social History Social History   Tobacco Use   Smoking status: Former    Types: Cigarettes   Smokeless tobacco: Never  Substance Use Topics   Alcohol use: Yes    Alcohol/week: 6.0 standard drinks of alcohol    Types: 6 Cans of beer per week    Comment: occasionally    Are there smokers in your home (other than you)?  No  Risk Factors Current exercise habits: Gym/ health club routine includes low impact aerobics and mod to heavy weightlifting.  Dietary issues discussed: none  Cardiac risk factors: advanced age (older than 72 for men, 84 for women).  Depression Screen (Note: if answer to either of the following is "Yes", a more complete depression screening is indicated)   Q1: Over the past two weeks, have you felt down, depressed or hopeless? No  Q2: Over the past two weeks, have you felt little interest or pleasure in doing things? No  Have  you lost interest or pleasure in daily life? No  Do you often feel hopeless? No  Do you cry easily over simple problems? No  Activities of Daily Living In your present state of health, do you have any difficulty performing the following activities?:  Driving? Yes Managing money?  Yes Feeding yourself? Yes Getting from bed to chair? Yes Climbing a flight of stairs? Yes Preparing food and eating?: Yes Bathing or showering? Yes Getting dressed: Yes Getting to the toilet? Yes Using the toilet:Yes Moving around from place to place: Yes In the past year have you fallen or had a near fall?:No   Are you sexually active?  Yes  Do you have more than one  partner?  No  Hearing Difficulties: No Do you often ask people to speak up or repeat themselves? No Do you experience ringing or noises in your ears? Yes Do you have difficulty understanding soft or whispered voices? No   Do you feel that you have a problem with memory? No  Do you often misplace items? No  Do you feel safe at home?  Yes  Cognitive Testing  Alert? Yes  Normal Appearance?Yes  Oriented to person? Yes  Place? Yes   Time? Yes  Recall of three objects?  Yes  Can perform simple calculations? Yes  Displays appropriate judgment?Yes  Can read the correct time from a watch face?Yes   Advanced Directives have been discussed with the patient? Yes  Indicate any recent Medical Services you may have received from other than [facility] providers in the past year (date may be approximate).  Immunization History  Administered Date(s) Administered   PFIZER(Purple Top)SARS-COV-2 Vaccination 12/15/2020, 01/05/2021   Tdap 06/03/2006    Screening Tests Health Maintenance  Topic Date Due   Hepatitis C Screening  Never done   Zoster Vaccines- Shingrix (1 of 2) Never done   DTaP/Tdap/Td (2 - Td or Tdap) 06/03/2016   Pneumonia Vaccine 54+ Years old (1 of 1 - PCV) Never done   Medicare Annual Wellness (AWV)  04/21/2022   INFLUENZA VACCINE  Never done   COVID-19 Vaccine (3 - 2023-24 season) 08/18/2023   MAMMOGRAM  08/11/2025   Colonoscopy  08/29/2028   DEXA SCAN  Completed   HPV VACCINES  Aged Out     History reviewed: allergies, current medications, past family history, past medical history, past social history, past surgical history, and problem list   Review of Systems Pertinent items noted in HPI and remainder of comprehensive ROS otherwise negative.    Objective:      Blood pressure (!) 130/90, pulse 64, temperature 98.1 F (36.7 C), resp. rate 18, height 5\' 4"  (1.626 m), weight 102 lb 8 oz (46.5 kg), SpO2 94%.  Body mass index is 17.59 kg/m.  BP (!) 130/90 (BP  Location: Left Arm, Patient Position: Sitting, Cuff Size: Normal)   Pulse 64   Temp 98.1 F (36.7 C)   Resp 18   Ht 5\' 4"  (1.626 m)   Wt 102 lb 8 oz (46.5 kg)   SpO2 94%   BMI 17.59 kg/m   Physical Examination Physical Exam Constitutional:      Appearance: Normal appearance.  HENT:     Head: Normocephalic and atraumatic.  Eyes:     Extraocular Movements: Extraocular movements intact.     Pupils: Pupils are equal, round, and reactive to light.  Cardiovascular:     Rate and Rhythm: Normal rate and regular rhythm.     Heart sounds: Normal heart  sounds.  Pulmonary:     Effort: Pulmonary effort is normal.     Breath sounds: Normal breath sounds.  Abdominal:     General: Bowel sounds are normal.     Palpations: Abdomen is soft.  Neurological:     General: No focal deficit present.     Mental Status: She is alert and oriented to person, place, and time.      Assessment:      Carrie Maxwell was seen today for annual exam.  Diagnoses and all orders for this visit:  Wellness examination -     CBC with Differential/Platelet -     CMP14 + Anion Gap -     Lipid panel -     TSH -     VITAMIN D 25 Hydroxy (Vit-D Deficiency, Fractures) -     Cancel: Pneumococcal conjugate vaccine 20-valent (Prevnar 20) -     Zoster Vaccine Adjuvanted (SHINGRIX) injection; Inject 0.5 mLs into the muscle once for 1 dose.  Age-related osteoporosis without current pathological fracture -     VITAMIN D 25 Hydroxy (Vit-D Deficiency, Fractures)  Polyarthritis  Gastroesophageal reflux disease, unspecified whether esophagitis present  Screening for diabetes mellitus (DM) -     Hemoglobin A1c  Need for prophylactic vaccination and inoculation against influenza -     Cancel: Influenza, MDCK, trivalent, PF(Flucelvax egg-free)  Encounter for immunization -     Influenza, MDCK, trivalent, PF(Flucelvax egg-free)  Borderline diabetes mellitus         Plan:      During the course of the visit  the patient was educated and counseled about appropriate screening and preventive services including:   Pneumococcal vaccine  Influenza vaccine  Diet review for nutrition referral? Yes ____  Not Indicated __X _   Patient Instructions (the written plan) was given to the patient.  Medicare Attestation I have personally reviewed: The patient's medical and social history Their use of alcohol, tobacco or illicit drugs Their current medications and supplements The patient's functional ability including ADLs,fall risks, home safety risks, cognitive, and hearing and visual impairment Diet and physical activities Evidence for depression or mood disorders  His BMI was noted. He is to eat healthy and continue to remain active.   Health maintenance discussed. We will obtain some yearly labs.   All answers were reviewed with the patient and necessary referrals were made:  Eloisa Northern, MD   09/25/2023

## 2023-09-25 NOTE — Assessment & Plan Note (Signed)
She does not like to take any medicine as pain is not that bad.

## 2023-09-25 NOTE — Assessment & Plan Note (Signed)
She takes fosamax 70 mg weekly. Next dexa scan will be in 2 years.

## 2023-09-26 LAB — LIPID PANEL
Chol/HDL Ratio: 2.4 {ratio} (ref 0.0–4.4)
Cholesterol, Total: 269 mg/dL — ABNORMAL HIGH (ref 100–199)
HDL: 114 mg/dL (ref >39)
LDL Chol Calc (NIH): 142 mg/dL — ABNORMAL HIGH (ref 0–99)
Triglycerides: 83 mg/dL (ref 0–149)
VLDL Cholesterol Cal: 13 mg/dL (ref 5–40)

## 2023-09-26 LAB — CMP14 + ANION GAP
ALT: 20 [IU]/L (ref 0–32)
AST: 35 [IU]/L (ref 0–40)
Albumin: 4.2 g/dL (ref 3.9–4.9)
Alkaline Phosphatase: 63 [IU]/L (ref 44–121)
Anion Gap: 16 mmol/L (ref 10.0–18.0)
BUN/Creatinine Ratio: 11 — ABNORMAL LOW (ref 12–28)
BUN: 9 mg/dL (ref 8–27)
Bilirubin Total: 0.6 mg/dL (ref 0.0–1.2)
CO2: 22 mmol/L (ref 20–29)
Calcium: 9.4 mg/dL (ref 8.7–10.3)
Chloride: 104 mmol/L (ref 96–106)
Creatinine, Ser: 0.82 mg/dL (ref 0.57–1.00)
Globulin, Total: 2.6 g/dL (ref 1.5–4.5)
Glucose: 89 mg/dL (ref 70–99)
Potassium: 5 mmol/L (ref 3.5–5.2)
Sodium: 142 mmol/L (ref 134–144)
Total Protein: 6.8 g/dL (ref 6.0–8.5)
eGFR: 77 mL/min/{1.73_m2} (ref >59)

## 2023-09-26 LAB — CBC WITH DIFFERENTIAL/PLATELET
Basophils Absolute: 0.1 10*3/uL (ref 0.0–0.2)
Basos: 1 %
EOS (ABSOLUTE): 0.2 10*3/uL (ref 0.0–0.4)
Eos: 3 %
Hematocrit: 43.4 % (ref 34.0–46.6)
Hemoglobin: 14.6 g/dL (ref 11.1–15.9)
Immature Grans (Abs): 0 10*3/uL (ref 0.0–0.1)
Immature Granulocytes: 0 %
Lymphocytes Absolute: 2 10*3/uL (ref 0.7–3.1)
Lymphs: 33 %
MCH: 33.8 pg — ABNORMAL HIGH (ref 26.6–33.0)
MCHC: 33.6 g/dL (ref 31.5–35.7)
MCV: 101 fL — ABNORMAL HIGH (ref 79–97)
Monocytes Absolute: 0.4 10*3/uL (ref 0.1–0.9)
Monocytes: 7 %
Neutrophils Absolute: 3.3 10*3/uL (ref 1.4–7.0)
Neutrophils: 56 %
Platelets: 235 10*3/uL (ref 150–450)
RBC: 4.32 x10E6/uL (ref 3.77–5.28)
RDW: 12.2 % (ref 11.7–15.4)
WBC: 5.9 10*3/uL (ref 3.4–10.8)

## 2023-09-26 LAB — HEMOGLOBIN A1C
Est. average glucose Bld gHb Est-mCnc: 114 mg/dL
Hgb A1c MFr Bld: 5.6 % (ref 4.8–5.6)

## 2023-09-26 LAB — VITAMIN D 25 HYDROXY (VIT D DEFICIENCY, FRACTURES): Vit D, 25-Hydroxy: 60 ng/mL (ref 30.0–100.0)

## 2023-09-26 LAB — TSH: TSH: 2.57 u[IU]/mL (ref 0.450–4.500)

## 2023-09-27 ENCOUNTER — Encounter: Payer: Self-pay | Admitting: Internal Medicine

## 2023-09-27 DIAGNOSIS — R7303 Prediabetes: Secondary | ICD-10-CM | POA: Insufficient documentation

## 2023-09-27 NOTE — Assessment & Plan Note (Signed)
I will do HbA1c today.  

## 2023-09-27 NOTE — Progress Notes (Signed)
Patient called.  Patient aware.  

## 2023-10-15 ENCOUNTER — Other Ambulatory Visit: Payer: Self-pay

## 2023-10-15 MED ORDER — ALENDRONATE SODIUM 70 MG PO TABS: 70.0000 mg | ORAL_TABLET | ORAL | 3 refills | Status: DC

## 2023-10-23 ENCOUNTER — Other Ambulatory Visit: Payer: Self-pay

## 2023-10-23 MED ORDER — ALENDRONATE SODIUM 70 MG PO TABS: 70.0000 mg | ORAL_TABLET | ORAL | 3 refills | Status: DC

## 2023-10-28 ENCOUNTER — Other Ambulatory Visit: Payer: Self-pay

## 2023-10-28 MED ORDER — ALENDRONATE SODIUM 70 MG PO TABS: 70.0000 mg | ORAL_TABLET | ORAL | 3 refills | Status: DC

## 2023-12-25 ENCOUNTER — Ambulatory Visit: Payer: PPO | Admitting: Internal Medicine

## 2023-12-25 VITALS — BP 122/74 | HR 71 | Temp 98.3°F | Resp 18 | Ht 64.0 in | Wt 103.0 lb

## 2023-12-25 DIAGNOSIS — R7303 Prediabetes: Secondary | ICD-10-CM | POA: Diagnosis not present

## 2023-12-25 DIAGNOSIS — K219 Gastro-esophageal reflux disease without esophagitis: Secondary | ICD-10-CM | POA: Diagnosis not present

## 2023-12-25 DIAGNOSIS — M130 Polyarthritis, unspecified: Secondary | ICD-10-CM | POA: Diagnosis not present

## 2023-12-25 DIAGNOSIS — M810 Age-related osteoporosis without current pathological fracture: Secondary | ICD-10-CM | POA: Diagnosis not present

## 2023-12-25 MED ORDER — FAMOTIDINE 20 MG PO TABS: 20.0000 mg | ORAL_TABLET | Freq: Two times a day (BID) | ORAL | 6 refills | Status: AC

## 2023-12-25 MED ORDER — ALENDRONATE SODIUM 70 MG PO TABS: 70.0000 mg | ORAL_TABLET | ORAL | 3 refills | Status: AC

## 2023-12-25 NOTE — Assessment & Plan Note (Signed)
 She take tylenol arthritis as needed.

## 2023-12-25 NOTE — Assessment & Plan Note (Signed)
 She will take pepcid as needed

## 2023-12-25 NOTE — Progress Notes (Signed)
   Office Visit  Subjective   Patient ID: Carrie Maxwell   DOB: 10/26/1956   Age: 68 y.o.   MRN: [id number]   Chief Complaint Chief Complaint  Patient presents with   Follow-up    3 month follow up     History of Present Illness 68 years old female who is retired and work at [employer]. She has osteoprosis. She takes fosamax  70 every week without any side effect.   She has acid reflux and it comes on and off. She take over the counter medicine.   She also has borderline diabetes and her last HbA1c was 5.6% in October 24. She will continue to watch her diet.    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   HENT: Negative.    Respiratory: Negative.    Cardiovascular: Negative.   Gastrointestinal: Negative.   Musculoskeletal:  Positive for joint pain.  Neurological: Negative.        Objective:    Vitals BP 122/74 (BP Location: Left Arm, Patient Position: Sitting)   Pulse 71   Temp 98.3 F (36.8 C)   Resp 18   Ht 5' 4 (1.626 m)   Wt 103 lb (46.7 kg)   SpO2 98%   BMI 17.68 kg/m    Physical Examination Physical Exam Constitutional:      Appearance: Normal appearance.  HENT:     Head: Normocephalic and atraumatic.  Cardiovascular:     Rate and Rhythm: Normal rate and regular rhythm.     Pulses: Normal pulses.     Heart sounds: Normal heart sounds.  Neurological:     General: No focal deficit present.     Mental Status: She is alert and oriented to person, place, and time.        Assessment & Plan:   Polyarthritis She take tylenol arthritis as needed.  Osteoporosis She will continue with fosamax  70 mg weekly.  GERD (gastroesophageal reflux disease) She will take pepcid  as needed    Return in about 6 months (around 06/23/2024).   Roetta Dare, MD

## 2023-12-25 NOTE — Assessment & Plan Note (Signed)
 She will continue with fosamax 70 mg weekly.

## 2024-06-24 ENCOUNTER — Encounter: Payer: Self-pay | Admitting: Internal Medicine

## 2024-06-24 ENCOUNTER — Ambulatory Visit: Payer: PPO | Admitting: Internal Medicine

## 2024-06-24 VITALS — BP 118/80 | HR 78 | Temp 98.0°F | Resp 18 | Ht 64.0 in | Wt 101.5 lb

## 2024-06-24 DIAGNOSIS — K219 Gastro-esophageal reflux disease without esophagitis: Secondary | ICD-10-CM

## 2024-06-24 DIAGNOSIS — M810 Age-related osteoporosis without current pathological fracture: Secondary | ICD-10-CM

## 2024-06-24 NOTE — Assessment & Plan Note (Signed)
 Stable and she occasionally use Pepcid .

## 2024-06-24 NOTE — Assessment & Plan Note (Signed)
 She will continue with exercises and take Fosamax  once a week.

## 2024-06-24 NOTE — Progress Notes (Signed)
   Office Visit  Subjective   Patient ID: Carrie Maxwell   DOB: August 09, 1956   Age: 68 y.o.   MRN: [id number]   Chief Complaint Chief Complaint  Patient presents with   follow up    6 month     History of Present Illness 68 years old female who is here for follow-up. She denies any complaint.  She says that she look after herself and do regular exercise. She denies any shortness of breath or chest pain during exercise.   She is retired and work at [employer].   She has osteoprosis. She takes fosamax  70 every week without any side effect.    She has acid reflux and it comes on and off. She take over the counter medicine.    She also has borderline diabetes and her last HbA1c was 5.6% in October 24 but her hemoglobin A1c last October was 5.6. She will continue to watch her diet.   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   HENT: Negative.    Respiratory: Negative.    Cardiovascular: Negative.   Gastrointestinal: Negative.   Neurological: Negative.        Objective:    Vitals BP 118/80   Pulse 78   Temp 98 F (36.7 C)   Resp 18   Ht 5' 4 (1.626 m)   Wt 101 lb 8 oz (46 kg)   SpO2 99%   BMI 17.42 kg/m    Physical Examination Physical Exam Constitutional:      Appearance: Normal appearance.  HENT:     Head: Normocephalic and atraumatic.  Cardiovascular:     Rate and Rhythm: Normal rate and regular rhythm.     Heart sounds: Normal heart sounds.  Pulmonary:     Effort: Pulmonary effort is normal.     Breath sounds: Normal breath sounds.  Abdominal:     General: Bowel sounds are normal.     Palpations: Abdomen is soft.  Neurological:     General: No focal deficit present.     Mental Status: She is alert and oriented to person, place, and time.        Assessment & Plan:    GERD (gastroesophageal reflux disease)  Stable and she occasionally use Pepcid .  Osteoporosis   She will continue with exercises and take Fosamax  once a week.    Return in about 3 months (around 09/24/2024) for For AWE on next visit.   Roetta Dare, MD

## 2024-09-24 ENCOUNTER — Encounter: Admitting: Internal Medicine

## 2024-09-30 ENCOUNTER — Ambulatory Visit: Admitting: Internal Medicine

## 2024-09-30 ENCOUNTER — Encounter: Payer: Self-pay | Admitting: Internal Medicine

## 2024-09-30 VITALS — BP 128/80 | HR 71 | Temp 97.8°F | Resp 18 | Ht 64.0 in | Wt 102.1 lb

## 2024-09-30 DIAGNOSIS — Z681 Body mass index (BMI) 19 or less, adult: Secondary | ICD-10-CM

## 2024-09-30 DIAGNOSIS — R7303 Prediabetes: Secondary | ICD-10-CM

## 2024-09-30 DIAGNOSIS — K219 Gastro-esophageal reflux disease without esophagitis: Secondary | ICD-10-CM

## 2024-09-30 DIAGNOSIS — M810 Age-related osteoporosis without current pathological fracture: Secondary | ICD-10-CM

## 2024-09-30 DIAGNOSIS — Z0000 Encounter for general adult medical examination without abnormal findings: Secondary | ICD-10-CM

## 2024-09-30 DIAGNOSIS — M130 Polyarthritis, unspecified: Secondary | ICD-10-CM

## 2024-09-30 MED ORDER — TETANUS-DIPHTHERIA TOXOIDS TD 2-2 LF/0.5ML IM SUSP: 0.5000 mL | Freq: Once | INTRAMUSCULAR | 0 refills | Status: AC

## 2024-09-30 MED ORDER — PREVNAR 20 0.5 ML IM SUSY: 0.5000 mL | PREFILLED_SYRINGE | INTRAMUSCULAR | 0 refills | Status: AC

## 2024-09-30 NOTE — Assessment & Plan Note (Signed)
 Her symptoms are controlled with Pepcid  20 mg twice a day.

## 2024-09-30 NOTE — Assessment & Plan Note (Signed)
 I will evaluate her for inflammatory arthritis and will do sed rate, CRP, ANA, RF and anti CCP.

## 2024-09-30 NOTE — Assessment & Plan Note (Signed)
 She is due for tetanus booster and pneumonia vaccine.  She will also get flu shot at pharmacy.  Her colonoscopy is up-to-date.  I will schedule her for mammogram and bone density scan.

## 2024-09-30 NOTE — Assessment & Plan Note (Signed)
 She has history of borderline diabetes and I will do hemoglobin A1c today.

## 2024-09-30 NOTE — Progress Notes (Signed)
 Office Visit  Subjective   Patient ID: Carrie Maxwell   DOB: 24-Aug-1956   Age: 68 y.o.   MRN: [id number]   Chief Complaint Chief Complaint  Patient presents with   Annual Exam    Annual exam      History of Present Illness 68 years old female is here for annual wellness examination. She live with her husband. No fall within last 1 year. She drive, she work as Adult nurse aids. She does not smoke, she occasionally drink 5 beers per week.  She got flu shot last year and she will get flu and COVID vaccine. She is not sure if she has pneumonia vaccine before. She think she has 2 shingle vaccine. She is due for tetanus booster.   She has osteoprosis and her bone density scan was long time a ago. She take Fosamax  70 mg once a week without any side effect.   Her colonoscopy was in 9/22 at [facility] and next scope will be in 08/2026.  She has pap smear long time ago. She has  hysterectomy and her last pap smear was in [place]  and she was told that she does not need repeat colonoscopy.  She examine her breasts.  Her last mammogram was August 2024 and that was normal.  She is due for screening mammogram.    She takes Pepcid  for acid reflex but symptoms are much better.    She is also complaining of arthritis in her hand and her knuckles are swollen and she is complaining of pain in her hands.  She denies any stiffness.  She says that she never been investigated for that.   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   HENT: Negative.    Respiratory: Negative.    Cardiovascular: Negative.   Gastrointestinal: Negative.   Musculoskeletal:  Positive for joint pain.  Neurological: Negative.        Objective:    Vitals BP 128/80   Pulse 71   Temp 97.8 F (36.6 C)   Resp 18   Ht 5' 4 (1.626  m)   Wt 102 lb 2 oz (46.3 kg)   SpO2 97%   BMI 17.53 kg/m    Physical Examination Physical Exam Constitutional:      Appearance: Normal appearance.  Cardiovascular:     Rate and Rhythm: Normal rate and regular rhythm.     Heart sounds: Normal heart sounds.  Pulmonary:     Effort: Pulmonary effort is normal.     Breath sounds: Normal breath sounds.  Abdominal:     General: Bowel sounds are normal.     Palpations: Abdomen is soft.  Neurological:     General: No focal deficit present.     Mental Status: She is alert and oriented to person, place, and time.        Assessment & Plan:   GERD (gastroesophageal reflux disease)   Her symptoms are controlled with Pepcid  20 mg twice a day.  Polyarthritis   I will evaluate her for inflammatory arthritis and will do sed rate, CRP, ANA, RF and anti CCP.  Osteoporosis   She takes Fosamax  and I will schedule her for bone density scan.  Borderline diabetes mellitus   She has history of borderline diabetes and I will  do hemoglobin A1c today.  Wellness examination   She is due for tetanus booster and pneumonia vaccine.  She will also get flu shot at pharmacy.  Her colonoscopy is up-to-date.  I will schedule her for mammogram and bone density scan.    Return in about 3 months (around 12/31/2024).   Roetta Dare, MD

## 2024-09-30 NOTE — Assessment & Plan Note (Signed)
 She takes Fosamax  and I will schedule her for bone density scan.

## 2024-10-02 ENCOUNTER — Ambulatory Visit: Payer: Self-pay

## 2024-10-02 LAB — CMP14 + ANION GAP
ALT: 21 IU/L (ref 0–32)
AST: 30 IU/L (ref 0–40)
Albumin: 4.2 g/dL (ref 3.9–4.9)
Alkaline Phosphatase: 55 IU/L (ref 49–135)
Anion Gap: 13 mmol/L (ref 10.0–18.0)
BUN/Creatinine Ratio: 13 (ref 12–28)
BUN: 9 mg/dL (ref 8–27)
Bilirubin Total: 0.3 mg/dL (ref 0.0–1.2)
CO2: 25 mmol/L (ref 20–29)
Calcium: 9.4 mg/dL (ref 8.7–10.3)
Chloride: 104 mmol/L (ref 96–106)
Creatinine, Ser: 0.72 mg/dL (ref 0.57–1.00)
Globulin, Total: 2.5 g/dL (ref 1.5–4.5)
Glucose: 97 mg/dL (ref 70–99)
Potassium: 4.5 mmol/L (ref 3.5–5.2)
Sodium: 142 mmol/L (ref 134–144)
Total Protein: 6.7 g/dL (ref 6.0–8.5)
eGFR: 90 mL/min/1.73 (ref >59)

## 2024-10-02 LAB — HEMOGLOBIN A1C
Est. average glucose Bld gHb Est-mCnc: 111 mg/dL
Hgb A1c MFr Bld: 5.5 % (ref 4.8–5.6)

## 2024-10-02 LAB — SEDIMENTATION RATE: Sed Rate: 9 mm/h (ref 0–40)

## 2024-10-02 LAB — ANTINUCLEAR ANTIBODIES, IFA: ANA Titer 1: NEGATIVE

## 2024-10-02 LAB — C-REACTIVE PROTEIN: CRP: <1 mg/L (ref 0–10)

## 2024-10-02 LAB — LIPID PANEL
Chol/HDL Ratio: 2.4 ratio (ref 0.0–4.4)
Cholesterol, Total: 233 mg/dL — ABNORMAL HIGH (ref 100–199)
HDL: 97 mg/dL (ref >39)
LDL Chol Calc (NIH): 121 mg/dL — ABNORMAL HIGH (ref 0–99)
Triglycerides: 86 mg/dL (ref 0–149)
VLDL Cholesterol Cal: 15 mg/dL (ref 5–40)

## 2024-10-02 LAB — RHEUMATOID FACTOR: Rheumatoid fact SerPl-aCnc: <10 [IU]/mL (ref <14.0)

## 2024-10-06 NOTE — Progress Notes (Signed)
 Patient called.  Patient aware.

## 2024-10-07 LAB — ANTI-CCP AB, IGG + IGA (RDL): Anti-CCP Ab, IgG + IgA (RDL): <20 U (ref <20)

## 2024-10-08 ENCOUNTER — Inpatient Hospital Stay (HOSPITAL_BASED_OUTPATIENT_CLINIC_OR_DEPARTMENT_OTHER)
Admission: RE | Admit: 2024-10-08 | Discharge: 2024-10-08 | Payer: Self-pay | Attending: Internal Medicine | Admitting: Internal Medicine

## 2024-10-08 ENCOUNTER — Ambulatory Visit (INDEPENDENT_AMBULATORY_CARE_PROVIDER_SITE_OTHER)
Admission: RE | Admit: 2024-10-08 | Discharge: 2024-10-08 | Disposition: A | Discharge status: Home / Self Care | Payer: Self-pay | Source: Ambulatory Visit | Attending: Internal Medicine | Admitting: Internal Medicine

## 2024-10-08 DIAGNOSIS — M810 Age-related osteoporosis without current pathological fracture: Secondary | ICD-10-CM

## 2024-10-08 DIAGNOSIS — Z1231 Encounter for screening mammogram for malignant neoplasm of breast: Secondary | ICD-10-CM | POA: Diagnosis not present

## 2024-11-03 NOTE — Progress Notes (Signed)
 Patient called.  Patient aware.   Dr caleen : She has osteopenia on bone density scan. She need to do weight bearing exercises

## 2024-12-31 ENCOUNTER — Encounter: Payer: Self-pay | Admitting: Internal Medicine

## 2024-12-31 ENCOUNTER — Ambulatory Visit: Admitting: Internal Medicine

## 2024-12-31 VITALS — BP 122/78 | HR 66 | Temp 98.0°F | Resp 18 | Ht 64.0 in | Wt 104.4 lb

## 2024-12-31 DIAGNOSIS — M130 Polyarthritis, unspecified: Secondary | ICD-10-CM | POA: Diagnosis not present

## 2024-12-31 DIAGNOSIS — M810 Age-related osteoporosis without current pathological fracture: Secondary | ICD-10-CM | POA: Diagnosis not present

## 2024-12-31 DIAGNOSIS — R7303 Prediabetes: Secondary | ICD-10-CM

## 2024-12-31 DIAGNOSIS — K219 Gastro-esophageal reflux disease without esophagitis: Secondary | ICD-10-CM | POA: Diagnosis not present

## 2024-12-31 NOTE — Assessment & Plan Note (Signed)
 Continue Fosamax  70 mg once a week.  He denies any side effect.  He will continue with weight, resistance training and exercise.

## 2024-12-31 NOTE — Assessment & Plan Note (Signed)
 She will continue to watch her diet.

## 2024-12-31 NOTE — Assessment & Plan Note (Signed)
 She will continue with Pepcid  20 mg twice a day and her symptoms are controlled

## 2024-12-31 NOTE — Assessment & Plan Note (Signed)
 If you do not want to to see rheumatologist so we will continue to monitor

## 2024-12-31 NOTE — Progress Notes (Signed)
" ° °  Office Visit  Subjective   Patient ID: GELSEY AMYX   DOB: [birth year]   Age: 69 y.o.   MRN: [id number]   Chief Complaint Chief Complaint  Patient presents with   Follow-up    3 month     History of Present Illness 69 years old female is here for follow-up.  She has bone density scan that shows osteoporosis of left radius and osteopenia of both hips.  She started taking Fosamax  once a week and denies any side effect.  She says that she goes to gym and do regular exercise.  Her vitamin D  level was 60.   She has gastroesophageal reflux disease and she said that she has realized that she has to take Pepcid  every day as that is helping her.  She is also complaining of swelling of her both hand middle interphalangeal joints and little deformity.  I did workup including sed rate, C-reactive protein, rheumatoid factor and that workup was negative.  She says that she is still her but not a lot.  I have suggested to refer her to rheumatologist but she do not want to do that.  She has borderline diabetes and her hemoglobin A1c on May 22, 2023 was 5.7.  She was watching her diet and her hemoglobin A1c on September 30, 2024 was 5.5.  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   HENT: Negative.    Respiratory: Negative.    Cardiovascular: Negative.   Gastrointestinal: Negative.   Musculoskeletal:  Positive for joint pain.  Neurological: Negative.        Objective:    Vitals BP 122/78   Pulse 66   Temp 98 F (36.7 C)   Resp 18   Ht 5' 4 (1.626 m)   Wt 104 lb 6 oz (47.3 kg)   SpO2 99%   BMI 17.92 kg/m    Physical Examination Physical Exam Constitutional:      Appearance: Normal appearance.  Cardiovascular:     Rate and Rhythm: Normal rate and regular rhythm.     Heart sounds: Normal heart sounds.   Pulmonary:     Effort: Pulmonary effort is normal.     Breath sounds: Normal breath sounds.  Abdominal:     General: Bowel sounds are normal.     Palpations: Abdomen is soft.  Musculoskeletal:        General: Swelling present.     Comments: Middle phalanx of both hands  Neurological:     General: No focal deficit present.     Mental Status: She is alert and oriented to person, place, and time.        Assessment & Plan:   GERD (gastroesophageal reflux disease) She will continue with Pepcid  20 mg twice a day and her symptoms are controlled  Polyarthritis If you do not want to to see rheumatologist so we will continue to monitor  Osteoporosis Continue Fosamax  70 mg once a week.  He denies any side effect.  He will continue with weight, resistance training and exercise.  Borderline diabetes mellitus She will continue to watch her diet.    Return in about 3 months (around 03/31/2025).   Roetta Dare, MD      [1] No Known Allergies  "

## 2025-01-07 ENCOUNTER — Other Ambulatory Visit: Payer: Self-pay

## 2025-01-07 MED ORDER — ALENDRONATE SODIUM 70 MG PO TABS: 70.0000 mg | ORAL_TABLET | ORAL | 0 refills | Status: DC

## 2025-01-19 ENCOUNTER — Other Ambulatory Visit: Payer: Self-pay | Admitting: Internal Medicine

## 2025-03-29 ENCOUNTER — Ambulatory Visit: Admitting: Internal Medicine
